# Patient Record
Sex: Male | Born: 1975 | Race: White | Hispanic: No | Marital: Married | Smoking: Never smoker
Health system: Southern US, Community
[De-identification: ages and names within clinical notes are randomized; demographics above are authoritative.]

## PROBLEM LIST (undated history)

## (undated) DIAGNOSIS — J302 Other seasonal allergic rhinitis: Secondary | ICD-10-CM

## (undated) HISTORY — DX: Other seasonal allergic rhinitis: J30.2

---

## 1998-10-31 HISTORY — PX: INGUINAL HERNIA REPAIR: SUR1180

## 1999-02-01 ENCOUNTER — Ambulatory Visit (HOSPITAL_BASED_OUTPATIENT_CLINIC_OR_DEPARTMENT_OTHER): Admission: RE | Admit: 1999-02-01 | Discharge: 1999-02-01 | Payer: Self-pay | Admitting: General Surgery

## 2000-10-31 HISTORY — PX: LAPAROSCOPIC INGUINAL HERNIA REPAIR: SUR788

## 2002-04-10 ENCOUNTER — Ambulatory Visit (HOSPITAL_COMMUNITY): Admission: RE | Admit: 2002-04-10 | Discharge: 2002-04-10 | Payer: Self-pay | Admitting: General Surgery

## 2006-10-31 HISTORY — PX: WISDOM TOOTH EXTRACTION: SHX21

## 2010-01-30 ENCOUNTER — Emergency Department (HOSPITAL_COMMUNITY): Admission: EM | Admit: 2010-01-30 | Discharge: 2010-01-30 | Payer: Self-pay | Admitting: Emergency Medicine

## 2010-01-30 IMAGING — CR DG CHEST 2V
2 series · 2 of 2 positions shown · non-contrast
Comparison: None.

CLINICAL DATA: Chest pain

CHEST - 2 VIEW

[w chest pa]
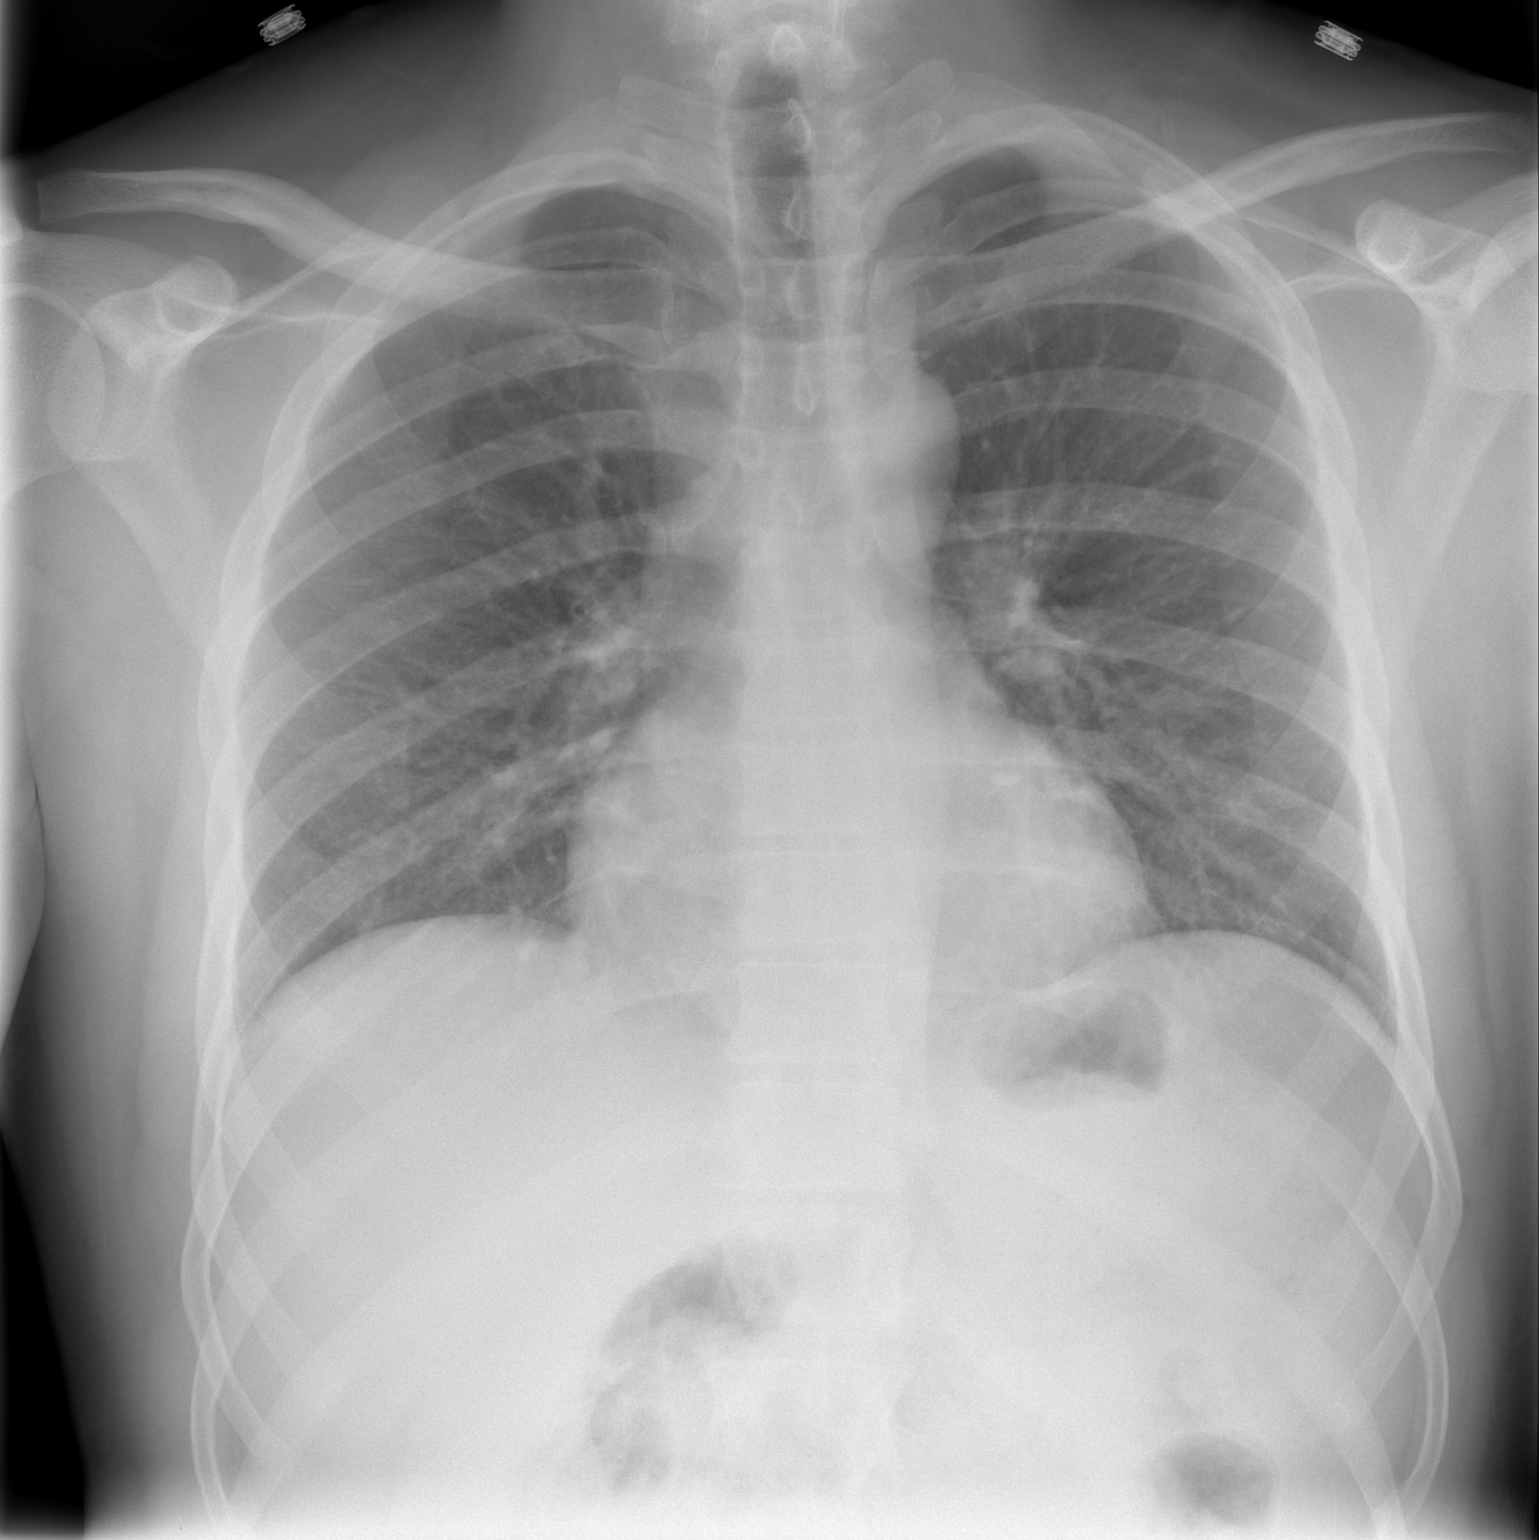

[w chest lat]
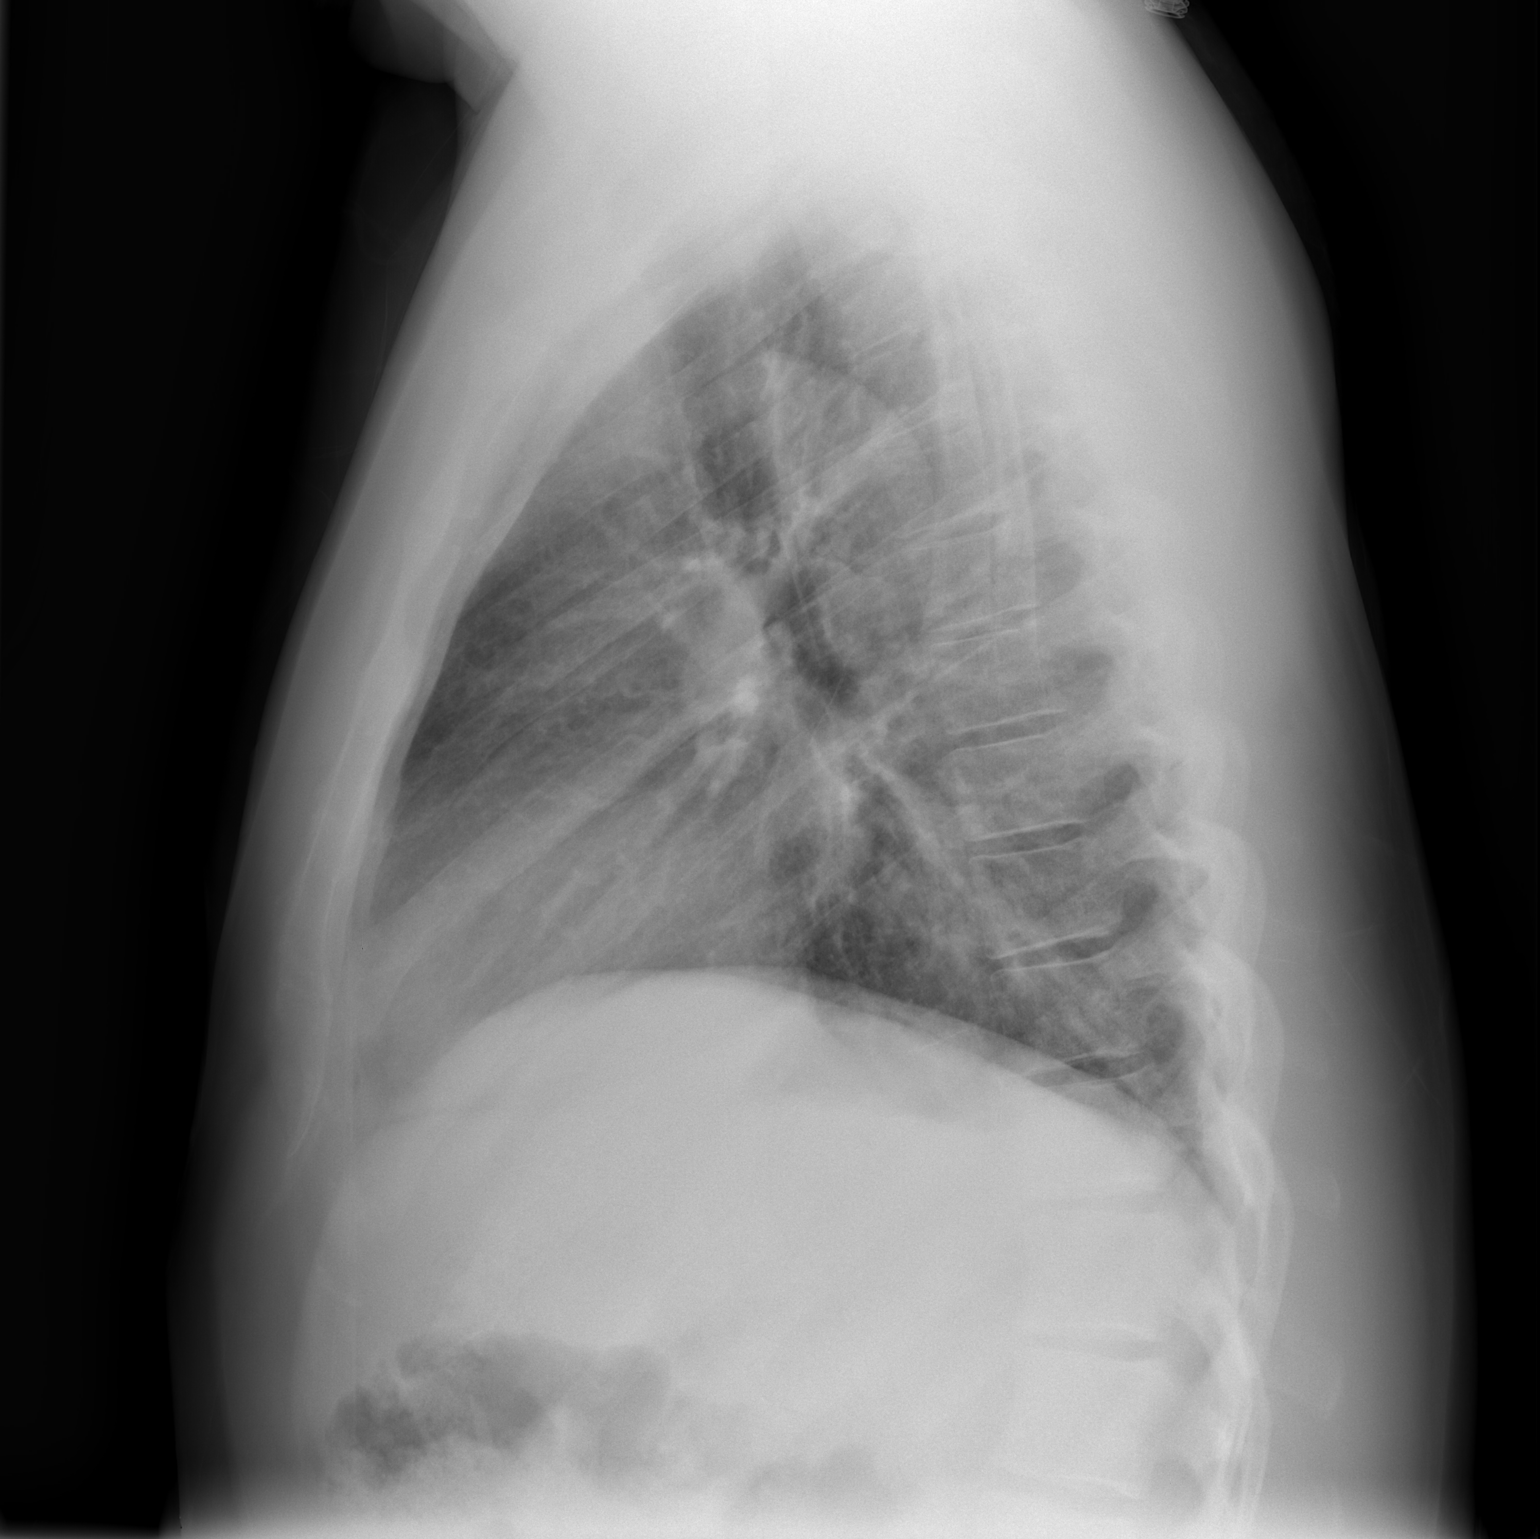

[2 of 2 positions shown; findings below may reference images not displayed]

FINDINGS: Normal mediastinum and heart silhouette.  There is a
coarsened central bronchovascular markings.  No clear focal
consolidation.  No pneumothorax.  No pleural fluid.  No bony
abnormality.
IMPRESSION: Coarsened central bronchovascular markings suggest viral process.
Would also consider bronchitis.

## 2011-01-19 LAB — COMPREHENSIVE METABOLIC PANEL
ALT: 29 U/L (ref 0–53)
BUN: 13 mg/dL (ref 6–23)
CO2: 25 mEq/L (ref 19–32)
Chloride: 105 mEq/L (ref 96–112)
Creatinine, Ser: 0.98 mg/dL (ref 0.4–1.5)
GFR calc Af Amer: >60 mL/min (ref >60)
GFR calc non Af Amer: >60 mL/min (ref >60)
Potassium: 4.5 mEq/L (ref 3.5–5.1)
Sodium: 136 mEq/L (ref 135–145)
Total Bilirubin: UNDETERMINED mg/dL (ref 0.3–1.2)
Total Protein: 6.7 g/dL (ref 6.0–8.3)

## 2011-01-19 LAB — CBC
Hemoglobin: 15.4 g/dL (ref 13.0–17.0)
MCV: 91 fL (ref 78.0–100.0)
Platelets: 222 10*3/uL (ref 150–400)
RBC: 5.04 MIL/uL (ref 4.22–5.81)

## 2011-01-19 LAB — DIFFERENTIAL
Eosinophils Absolute: 0.2 10*3/uL (ref 0.0–0.7)
Eosinophils Relative: 3 % (ref 0–5)
Lymphocytes Relative: 31 % (ref 12–46)
Lymphs Abs: 2.9 10*3/uL (ref 0.7–4.0)
Neutro Abs: 5.7 10*3/uL (ref 1.7–7.7)

## 2011-01-19 LAB — D-DIMER, QUANTITATIVE: D-Dimer, Quant: <0.22 ug/mL-FEU (ref 0.00–0.48)

## 2011-03-18 NOTE — Op Note (Signed)
[facility]  Patient:    [name], [name] Visit Number: [id number] MRN: [id number]          Service Type: DSU Location: DAY Attending Physician:  Delsa Bern Dictated by:   Lorne Skeens. Hoxworth, M.D. Proc. Date: 04/10/02 Admit Date:  04/10/2002                             Operative Report  PREOPERATIVE DIAGNOSIS:  Right inguinal hernia.  POSTOPERATIVE DIAGNOSIS:  Right inguinal hernia.  OPERATION:  Laparoscopic repair of right inguinal hernia.  SURGEON:  Lorne Skeens. Hoxworth, M.D.  ANESTHESIA:  General.  BRIEF HISTORY:  Jared Trevino is a 35 year old white male with a history of left inguinal hernia repair.  He now presents with a symptomatic reducible right inguinal hernia.  On this occasion, he has requested laparoscopic repair.  The nature of the procedure, its indications and risks of bleeding, infection were discussed and understood preoperatively.  He is now brought to the operating room for this procedure.  DESCRIPTION OF PROCEDURE:  The patient was brought to the operating room and placed in the supine position on the operating table.  General anesthesia was induced.  Foley catheter was place.  Preoperative antibiotics were given.  The abdomen was sterilely prepped and draped.  Local anesthesia was used to infiltrate the trocar sites.  A 1 cm incision was made in the umbilicus and dissection carried down to the midline fascia.  The rectus sheath was incised transversely for 1 cm and the right rectus muscle retracted laterally and the preperitoneal space entered.  The balloon dissection was passed into the space and its tip passed down to the pubic symphysis, and it was inflated.  There was good dissection of the right side.  The balloon was left in place for several minutes and then removed, and the balloon replaced and CO2 pressure applied.  Under direct vision, two 5 mm trocars were placed into the preperitoneal space.   Pubic symphysis was identified, and the Coopers ligament had been well dissected down to the iliac vessels which were identified and protected.  The direct space was well dissected, and there was no direct defect.  The cord structures were identified, and there was an obvious good size indirect sac extending along with the cord structures through the internal ring.  Laterally, the peritoneum had been well dissected off of the anterior abdominal wall, and this was dissected further out to the iliac spine.  Working medially back long the edge of the peritoneum, it seemed to be coursing along with the cord structures.  The indirect sac was then dissected away from cord structures.  There were a fair amount of adhesions between the cord and the sac, and the dissection was somewhat tedious but the sac was able to be separated and completely reduced out of the internal ring intact.  It was stripped back posteriorly away from the cord structures until it was well posterior and broadening out into the general peritoneal cavity. After the thorough dissection a large piece of Bard right-sided mesh was used oriented with suture and placed into the preperitoneal space.  It was unfurled and oriented and tacked initially to the pubic symphysis and then along Coopers ligament, down to but protecting the iliac vessels.  This was well unfurled laterally and tacked to the anterior abdominal wall where we could feel each tack through the anterior abdominal wall beginning along the anterior iliac spine  and working back medially avoiding the epigastric vessels.  The good size indirect sac was placed deep to the mesh and held in place with one tack.  With the inferior lateral edge of the mesh held in place, CO2 was removed and trocars removed.  The fascial defect at the umbilicus was repaired with a figure-of-eight suture of 0 Vicryl.  Skin incisions were closed with interrupted subcuticular 4-0 Monocryl  and Steri-Strips.  Sponge, needle and instrument counts were correct.  Dry sterile dressings were applied, and the patient was taken to recovery in good condition. Dictated by:   Lorne Skeens. Hoxworth, M.D. Attending Physician:  Delsa Bern DD:  04/10/02 TD:  04/12/02 Job: [id number]

## 2017-12-14 NOTE — Progress Notes (Signed)
Establish care and Acute visit  Assessment and Plan:  Jared Trevino was seen today for new patient (initial visit), cough and dizziness.  Diagnoses and all orders for this visit:  Encounter to establish care with new doctor  Fatigue, unspecified type -     CBC with Differential/Platelet -     BASIC METABOLIC PANEL WITH GFR -     Hepatic function panel  Dizziness Neuro exam unremarkable - likely mild labyrinthitis r/t a viral URI-   -     predniSONE (DELTASONE) 20 MG tablet; 2 tablets daily for 3 days, 1 tablet daily for 4 days. -     meclizine (ANTIVERT) 25 MG tablet; 1/2-1 pill up to 3 times daily for motion sickness/dizziness  Other orders Low suspicion based on history, patient has high deductible and requests to defer based on cost -     Cancel: Jared Trevino virus VCA antibody panel  Discussed med's effects and SE's. Screening labs and tests as requested with regular follow-up as recommended. Over 30 minutes of exam, counseling, chart review and critical decision making was performed  Future Appointments  Date Time Provider [place]  06/14/2018 10:00 AM Jared Comber, NP [facility] None     HPI BP 122/84   Pulse 68   Temp 97.7 F (36.5 C)   Wt 215 lb (97.5 kg)   SpO2 96%   42 y/o Caucasian Male Patient not currently established with a primary care, no significant medical history other than seasonal allergies presents to establish care and for acute visit - referred by physician friend to "rule out mono" - for recent intermittent dizziness x 4 weeks, mild fatigue x 2 weeks which is improving, and mild cough x 1 week productive of small amount of clear to white mucus. Denies sore throat, severe malaise, fever/ chills, tenderness of neck, headache, body aches. He does endorse some nasal congestion and sense of ear fullness bilaterally without decreased hearing, pain, discharge. He denies chest pain, palpitations, dyspnea, LE edema, extremity or neck pain. Denies nausea,  vomiting, abdominal pain, diarrhea.   He reports dizziness intermittent with the "room spinning" - no accompaniments, typically happens when he is upright, not notably associated with     Has not had the flu vaccine, typically declines.    Reports recent labwork limited to urinalysis with STD testing in 07/2017 for epididymitis - reports all negative/unremarkable.   Current Medications:  No current outpatient medications on file prior to visit.   No current facility-administered medications on file prior to visit.    Allergies:  Not on File Health Maintenance:   There is no immunization history on file for this patient.   Patient Care Team: Jared Pinto, MD as PCP - General (Internal Medicine)  Medical History:  does not have a problem list on file. Surgical History:  He  has a past surgical history that includes Inguinal hernia repair (Left, 2000); Laparoscopic inguinal hernia repair (Right, 2002); and Wisdom tooth extraction (2008). Family History:  His family history includes Breast cancer in his mother; CAD in his paternal grandfather; Heart attack (age of onset: 50) in his paternal grandfather; Melanoma (age of onset: 71) in his father. Social History:   reports that  has never smoked. he has never used smokeless tobacco. He reports that he drinks about 1.2 oz of alcohol per week. He reports that he does not use drugs. Review of Systems:  Review of Systems  Constitutional: Negative for chills, diaphoresis, fever, malaise/fatigue and weight loss.  HENT: Positive  for congestion. Negative for ear pain, hearing loss, sinus pain, sore throat and tinnitus.   Eyes: Negative for blurred vision and double vision.  Respiratory: Positive for cough (Mildly productive, was dry, now productive of small amount of clear discharge). Negative for hemoptysis, sputum production, shortness of breath and wheezing.   Cardiovascular: Negative for chest pain, palpitations, orthopnea,  claudication, leg swelling and PND.  Gastrointestinal: Negative for abdominal pain, blood in stool, constipation, diarrhea, heartburn, melena, nausea and vomiting.  Genitourinary: Negative.   Musculoskeletal: Negative.  Negative for joint pain and myalgias.  Skin: Negative for rash.  Neurological: Positive for dizziness. Negative for tingling, sensory change, focal weakness, weakness and headaches (Single day of mild headache last week).  Endo/Heme/Allergies: Negative for environmental allergies (Has intermittently in fall and spring).  Psychiatric/Behavioral: Negative for depression, memory loss and substance abuse. The patient is not nervous/anxious and does not have insomnia.     Physical Exam: There is no height or weight on file to calculate BMI. BP 122/84   Pulse 68   Temp 97.7 F (36.5 C)   Wt 215 lb (97.5 kg)   SpO2 96%  General Appearance: Well nourished, in no apparent distress.  Eyes: PERRLA, EOMs, conjunctiva no swelling or erythema.  Sinuses: No Frontal/maxillary tenderness  ENT/Mouth: Ext aud canals clear, normal light reflex with TMs without erythema, bulging. Good dentition. No erythema, swelling, or exudate on post pharynx. Tonsils not swollen or erythematous. Hearing normal.  Neck: Supple, thyroid normal. No bruits  Respiratory: Respiratory effort normal, BS equal bilaterally without rales, rhonchi, wheezing or stridor.  Cardio: RRR without murmurs, rubs or gallops. Brisk peripheral pulses without edema.  Chest: symmetric, with normal excursions and percussion.  Abdomen: Soft, nontender, no guarding, rebound, hernias, masses, or organomegaly.  Lymphatics: Non tender without lymphadenopathy.  Genitourinary: Defer Musculoskeletal: Full ROM all peripheral extremities,5/5 strength, and normal gait.  Skin: Warm, dry without rashes, lesions, ecchymosis. Neuro: Cranial nerves intact, reflexes equal bilaterally. Normal muscle tone, no cerebellar symptoms. Sensation intact.   Psych: Awake and oriented X 3, normal affect, Insight and Judgment appropriate.   EKG: Defer to [place] 11:53 AM [facility]

## 2017-12-15 ENCOUNTER — Encounter: Payer: Self-pay | Admitting: Adult Health

## 2017-12-15 ENCOUNTER — Ambulatory Visit (INDEPENDENT_AMBULATORY_CARE_PROVIDER_SITE_OTHER): Payer: PRIVATE HEALTH INSURANCE | Admitting: Adult Health

## 2017-12-15 VITALS — BP 122/84 | HR 68 | Temp 97.7°F | Wt 215.0 lb

## 2017-12-15 DIAGNOSIS — R5383 Other fatigue: Secondary | ICD-10-CM

## 2017-12-15 DIAGNOSIS — R42 Dizziness and giddiness: Secondary | ICD-10-CM

## 2017-12-15 DIAGNOSIS — Z7689 Persons encountering health services in other specified circumstances: Secondary | ICD-10-CM

## 2017-12-15 LAB — CBC WITH DIFFERENTIAL/PLATELET
BASOS ABS: 42 {cells}/uL (ref 0–200)
Basophils Relative: 0.8 %
EOS ABS: 302 {cells}/uL (ref 15–500)
EOS PCT: 5.7 %
HCT: 45.7 % (ref 38.5–50.0)
HEMOGLOBIN: 16.4 g/dL (ref 13.2–17.1)
Lymphs Abs: 1606 cells/uL (ref 850–3900)
MCH: 30.1 pg (ref 27.0–33.0)
MCHC: 35.9 g/dL (ref 32.0–36.0)
MCV: 84 fL (ref 80.0–100.0)
MONOS PCT: 8.6 %
MPV: 9.7 fL (ref 7.5–12.5)
NEUTROS ABS: 2894 {cells}/uL (ref 1500–7800)
NEUTROS PCT: 54.6 %
Platelets: 289 10*3/uL (ref 140–400)
RBC: 5.44 10*6/uL (ref 4.20–5.80)
RDW: 12.6 % (ref 11.0–15.0)
TOTAL LYMPHOCYTE: 30.3 %
WBC mixed population: 456 cells/uL (ref 200–950)
WBC: 5.3 10*3/uL (ref 3.8–10.8)

## 2017-12-15 LAB — BASIC METABOLIC PANEL WITH GFR
BUN: 14 mg/dL (ref 7–25)
CO2: 26 mmol/L (ref 20–32)
CREATININE: 1.1 mg/dL (ref 0.60–1.35)
Calcium: 10 mg/dL (ref 8.6–10.3)
Chloride: 104 mmol/L (ref 98–110)
GFR, Est African American: 97 mL/min/{1.73_m2} (ref >=60)
GFR, Est Non African American: 84 mL/min/{1.73_m2} (ref >=60)
GLUCOSE: 90 mg/dL (ref 65–99)
POTASSIUM: 4.1 mmol/L (ref 3.5–5.3)
Sodium: 140 mmol/L (ref 135–146)

## 2017-12-15 LAB — HEPATIC FUNCTION PANEL
AG RATIO: 2.1 (calc) (ref 1.0–2.5)
ALKALINE PHOSPHATASE (APISO): 52 U/L (ref 40–115)
ALT: 26 U/L (ref 9–46)
AST: 16 U/L (ref 10–40)
Albumin: 4.8 g/dL (ref 3.6–5.1)
BILIRUBIN TOTAL: 0.7 mg/dL (ref 0.2–1.2)
Bilirubin, Direct: 0.1 mg/dL (ref 0.0–0.2)
Globulin: 2.3 g/dL (calc) (ref 1.9–3.7)
Indirect Bilirubin: 0.6 mg/dL (calc) (ref 0.2–1.2)
TOTAL PROTEIN: 7.1 g/dL (ref 6.1–8.1)

## 2017-12-15 MED ORDER — MECLIZINE HCL 25 MG PO TABS: ORAL_TABLET | ORAL | 0 refills | Status: AC

## 2017-12-15 MED ORDER — PREDNISONE 20 MG PO TABS: ORAL_TABLET | ORAL | 0 refills | Status: DC

## 2017-12-15 NOTE — Patient Instructions (Addendum)
I'm sending in prednisone and antivert for dizziness - call me in a few weeks if still having lots of symptoms, or any new questions or concerns.    Labyrinthitis Labyrinthitis is an infection of the inner ear. Your inner ear is a fluid-filled system of tubes and canals (labyrinth). Nerve cells in your inner ear send signals for hearing and balance to your brain. When tiny germs (microorganisms) get inside the labyrinth, they harm the cells that send messages to the brain. Labyrinthitis can cause changes in hearing and balance. Most cases of labyrinthitis come on suddenly and they clear up within weeks. If the infection damages parts of the labyrinth, some symptoms may remain (chronic labyrinthitis). What are the causes? Viruses are the most common cause of labyrinthitis. Viruses that spread into the labyrinth are the same viruses that cause other diseases, such as:  Mononucleosis.  Measles.  Flu.  Herpes.  Bacteria can also cause labyrinthitis when they spread into the labyrinth from an infection in the brain or the middle ear. Bacteria can cause:  Serous labyrinthitis. This type of labyrinthitis develops when bacteria produce a poison (toxin) that gets inside the labyrinth.  Suppurative labyrinthitis. This type of labyrinthitis develops when bacteria get inside the labyrinth.  What increases the risk? You may be at greater risk for labyrinthitis if you:  Drink a lot of alcohol.  Smoke.  Take certain drugs.  Are not well rested (fatigued).  Are under a lot of stress.  Have allergies.  Recently had a nose or throat infection (upper respiratory infection) or an ear infection.  What are the signs or symptoms? Symptoms of labyrinthitis usually start suddenly. The symptoms can be mild or strong and may include:  Dizziness.  Hearing loss.  A feeling that you are moving when you are not (vertigo).  Ringing in the ear (tinnitus).  Nausea and vomiting.  Trouble focusing  your eyes.  Symptoms of chronic labyrinthitis may include:  Fatigue.  Confusion.  Hearing loss.  Tinnitus.  Poor balance.  Vertigo after sudden head movements.  How is this diagnosed? Your health care provider may suspect labyrinthitis if you suddenly get dizzy and lose hearing, especially if you had a recent upper respiratory infection. Your health care provider will perform a physical exam to:  Check your ears for infection.  Test your balance.  Check your eye movement.  Your health care provider may do several tests to rule out other causes of your symptoms and to help make a diagnosis of labyrinthitis. These may include:  Imaging studies, such as a CT scan or an MRI, to look for other causes of your symptoms.  Hearing tests.  Electronystagmography (ENG) to check your balance.  How is this treated? Treatment of labyrinthitis depends on the cause. If your labyrinthitis is caused by a virus, it may get better without treatment. If your labyrinthitis is caused by bacteria, you may need medicine to fight the infection (antibiotic medicine). You may also have treatment to relieve labyrinthitis symptoms. Treatments may include:  Medicines to: ? Stop dizziness. ? Relieve nausea. ? Treat the inflamed area. ? Speed up your recovery.  Bed rest until dizziness goes away.  Fluids given through an IV tube. You may need this treatment if you have too little fluid in your body (dehydrated) from repeated nausea and vomiting.  Follow these instructions at home:  Take medicines only as directed by your health care provider.  If you were prescribed an antibiotic medicine, finish all of it  even if you start to feel better.  Rest as much as possible.  Avoid loud noises and bright lights.  Do not make sudden movements until any dizziness goes away.  Do not drive until your health care provider says that you can.  Drink enough fluid to keep your urine clear or pale  yellow.  Work with a physical therapist if you still feel dizzy after several weeks. A therapist can teach you exercises to help you adjust to feeling dizzy (vestibular rehabilitation exercises).  Keep all follow-up visits as directed by your health care provider. This is important. Contact a health care provider if:  Your symptoms are not relieved by medicines.  Your symptoms last longer than two weeks.  You have a fever. Get help right away if:  You become very dizzy.  You have nausea or vomiting that does not go away.  Your hearing gets much worse very quickly. This information is not intended to replace advice given to you by your health care provider. Make sure you discuss any questions you have with your health care provider. Document Released: 11/07/2014 Document Revised: 03/24/2016 Document Reviewed: 06/18/2014 Elsevier Interactive Patient Education  2018 [employer].    Common causes of cough OR hoarseness OR sore throat:   Allergies, Viral Infections, Acid Reflux and Bacterial Infections.   Allergies and viral infections cause a cough OR sore throat by post nasal drip and are often worse at night, can also have sneezing, lower grade fevers, clear/yellow mucus. This is best treated with allergy medications or nasal sprays.  Please get on allegra for 1-2 weeks The strongest is allegra or fexafinadine  Cheapest at [employer], [employer], [employer]   Bacterial infections are more severe than allergies or viral infections with fever, teeth pain, fatigue. This can be treated with prednisone and the same over the counter medication and after 7 days can be treated with an antibiotic.   Silent reflux/GERD can cause a cough OR sore throat OR hoarseness WITHOUT heart burn because the esophagus that goes to the stomach and trachea that goes to the lungs are very close and when you lay down the acid can irritate your throat and lungs. This can cause hoarseness, cough, and wheezing. Please  stop any alcohol or anti-inflammatories like aleve/advil/ibuprofen and start an over the counter Prilosec or omeprazole 1-2 times daily 83mins before food for 2 weeks, then switch to over the counter zantac/ratinidine or pepcid/famotadine once at night for 2 weeks.    sometimes irritation causes more irritation. Try voice rest, use sugar free cough drops to prevent coughing, and try to stop clearing your throat.   If you ever have a cough that does not go away after trying these things please make a follow up visit for further evaluation or we can refer you to a specialist. Or if you ever have shortness of breath or chest pain go to the ER.

## 2018-06-14 ENCOUNTER — Encounter: Payer: Self-pay | Admitting: Adult Health

## 2019-06-20 ENCOUNTER — Encounter: Payer: Self-pay | Admitting: Adult Health

## 2020-07-21 DIAGNOSIS — H5203 Hypermetropia, bilateral: Secondary | ICD-10-CM | POA: Diagnosis not present

## 2020-07-21 DIAGNOSIS — H52203 Unspecified astigmatism, bilateral: Secondary | ICD-10-CM | POA: Diagnosis not present

## 2020-07-21 DIAGNOSIS — H18831 Recurrent erosion of cornea, right eye: Secondary | ICD-10-CM | POA: Diagnosis not present

## 2020-11-07 DIAGNOSIS — Z20822 Contact with and (suspected) exposure to covid-19: Secondary | ICD-10-CM | POA: Diagnosis not present

## 2021-12-03 DIAGNOSIS — Z125 Encounter for screening for malignant neoplasm of prostate: Secondary | ICD-10-CM | POA: Diagnosis not present

## 2021-12-03 DIAGNOSIS — E785 Hyperlipidemia, unspecified: Secondary | ICD-10-CM | POA: Diagnosis not present

## 2021-12-10 DIAGNOSIS — N390 Urinary tract infection, site not specified: Secondary | ICD-10-CM | POA: Diagnosis not present

## 2021-12-10 DIAGNOSIS — Z1339 Encounter for screening examination for other mental health and behavioral disorders: Secondary | ICD-10-CM | POA: Diagnosis not present

## 2021-12-10 DIAGNOSIS — Z0000 Encounter for general adult medical examination without abnormal findings: Secondary | ICD-10-CM | POA: Diagnosis not present

## 2021-12-10 DIAGNOSIS — Z1331 Encounter for screening for depression: Secondary | ICD-10-CM | POA: Diagnosis not present

## 2021-12-10 DIAGNOSIS — J309 Allergic rhinitis, unspecified: Secondary | ICD-10-CM | POA: Diagnosis not present

## 2022-03-09 DIAGNOSIS — R972 Elevated prostate specific antigen [PSA]: Secondary | ICD-10-CM | POA: Diagnosis not present

## 2022-03-11 ENCOUNTER — Other Ambulatory Visit: Payer: Self-pay | Admitting: Urology

## 2022-03-11 DIAGNOSIS — R972 Elevated prostate specific antigen [PSA]: Secondary | ICD-10-CM

## 2022-03-25 ENCOUNTER — Ambulatory Visit
Admission: RE | Admit: 2022-03-25 | Discharge: 2022-03-25 | Disposition: A | Discharge status: Home / Self Care | Payer: BC Managed Care – PPO | Source: Ambulatory Visit | Attending: Urology | Admitting: Urology

## 2022-03-25 DIAGNOSIS — R972 Elevated prostate specific antigen [PSA]: Secondary | ICD-10-CM | POA: Diagnosis not present

## 2022-03-25 DIAGNOSIS — R590 Localized enlarged lymph nodes: Secondary | ICD-10-CM | POA: Diagnosis not present

## 2022-03-25 IMAGING — MR MR PROSTATE WO/W CM
12 series · 48 of 48 positions shown · IV contrast (multihance)
Comparison: None Available.

CLINICAL DATA: Elevated PSA equal [REDACTED]-year-old male

EXAM:
MR PROSTATE WITHOUT AND WITH CONTRAST
TECHNIQUE: Multiplanar multisequence MRI images were obtained of the pelvis
centered about the prostate. Pre and post contrast images were
obtained.
CONTRAST:  20mL MULTIHANCE GADOBENATE DIMEGLUMINE 529 MG/ML IV SOLN

[Series 3: T2 · coronal · 3.0mm · 0.56mm/px · 1 of 23 slices shown (1 of 3)]
[im 1/23]
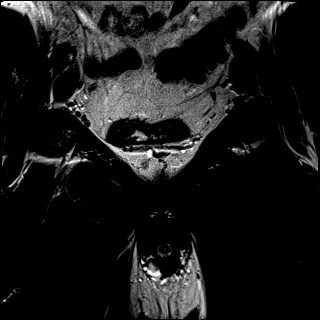

[Series 4: T1 · axial · 5.0mm · 1.25mm/px · 1 of 80 slices shown]
[im 1/80]
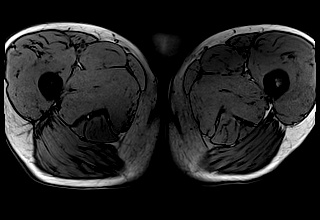

[Series 5: DWI · axial · 3.0mm · 1.75mm/px · z∈[+1,+58]mm · 2 of 60 slices shown (1 of 3)]
[im 1/60]
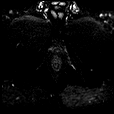
[im 60/60]
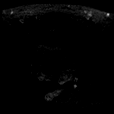

[Series 6: DWI · axial · 3.0mm · 1.75mm/px · 1 of 20 slices shown (2 of 3)]
[im 1/20]
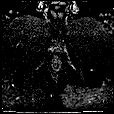

[Series 7: DWI · axial · 3.0mm · 1.75mm/px · 1 of 20 slices shown (3 of 3)]
[im 1/20]
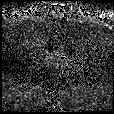

[Series 8: T2 · axial · 3.0mm · 0.56mm/px · 1 of 23 slices shown (2 of 3)]
[im 1/23]
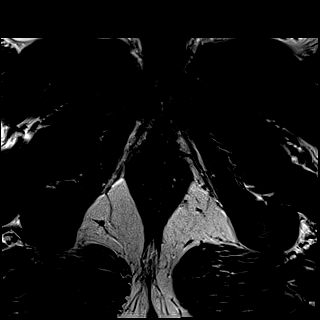

[Series 9: T2 · axial · 1.0mm · 1.04mm/px · z∈[-6,+65]mm · 2 of 72 slices shown (3 of 3)]
[im 1/72]
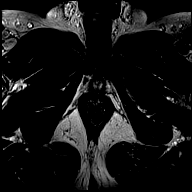
[im 72/72]
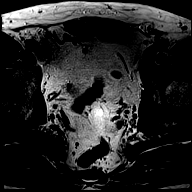

[Series 10: pre t1_twist_tra_dyn · axial · non-contrast · 3.5mm · 0.83mm/px · 1 of 20 slices shown]
[im 1/20]
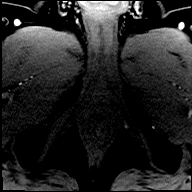

[Series 11: post t1_twist_tra_dyn-copy center · axial · non-contrast · 3.5mm · 0.83mm/px · z∈[-8,+59]mm · 17 of 600 slices shown]
[im 1/600]
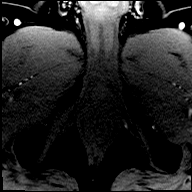
[im 38/600]
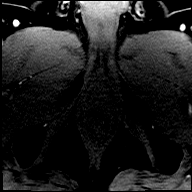
[im 75/600]
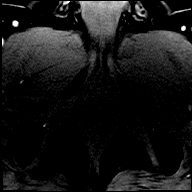
[im 113/600]
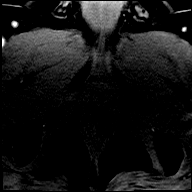
[im 150/600]
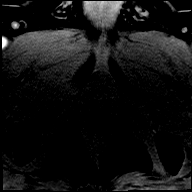
[im 188/600]
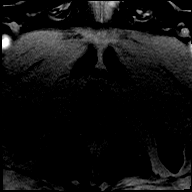
[im 225/600]
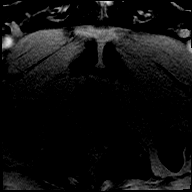
[im 263/600]
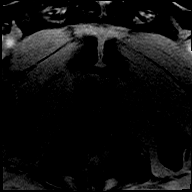
[im 300/600]
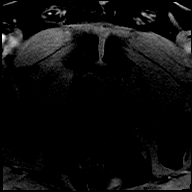
[im 337/600]
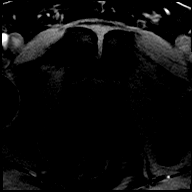
[im 375/600]
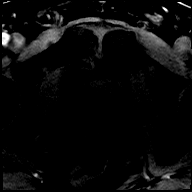
[im 412/600]
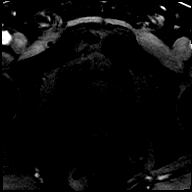
[im 450/600]
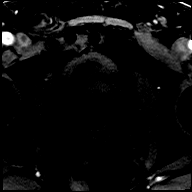
[im 487/600]
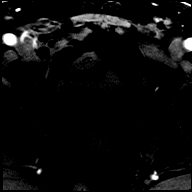
[im 525/600]
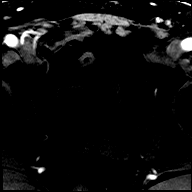
[im 562/600]
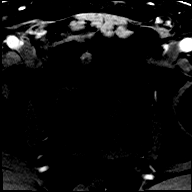
[im 600/600]
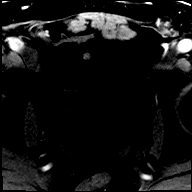

[Series 12: post t1_twist_tra_dyn-copy cent_sub · axial · 3.5mm · 0.83mm/px · z∈[-8,+59]mm · 17 of 580 slices shown]
[im 1/580]
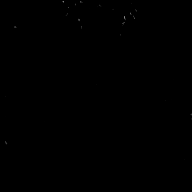
[im 37/580]
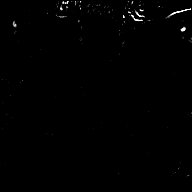
[im 73/580]
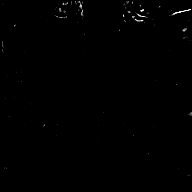
[im 109/580]
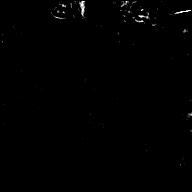
[im 145/580]
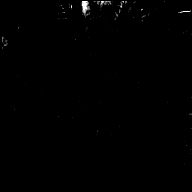
[im 181/580]
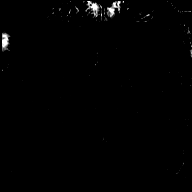
[im 218/580]
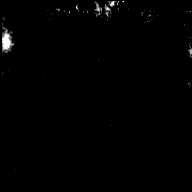
[im 254/580]
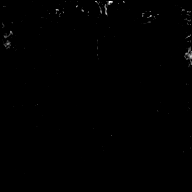
[im 290/580]
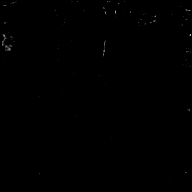
[im 326/580]
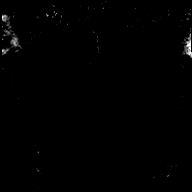
[im 362/580]
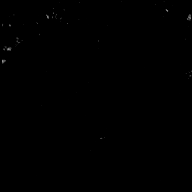
[im 399/580]
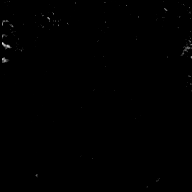
[im 435/580]
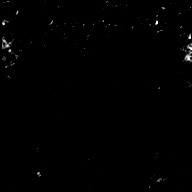
[im 471/580]
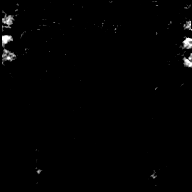
[im 507/580]
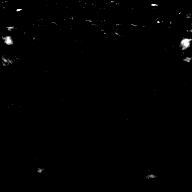
[im 543/580]
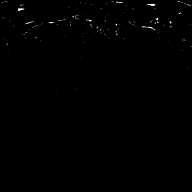
[im 580/580]
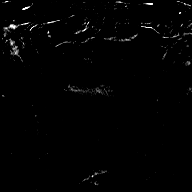

[Series 13: t1_vibe_dixon_tra_f · axial · 2.5mm · 0.91mm/px · z∈[-72,+126]mm · 2 of 80 slices shown]
[im 1/80]
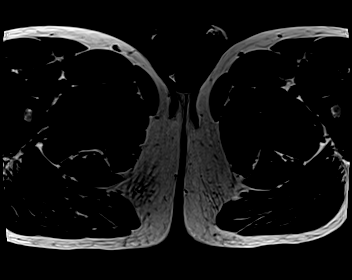
[im 80/80]
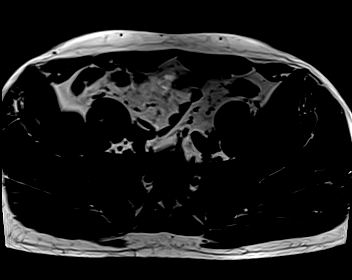

[Series 14: t1_vibe_dixon_tra_w · axial · 2.5mm · 0.91mm/px · z∈[-72,+126]mm · 2 of 80 slices shown]
[im 1/80]
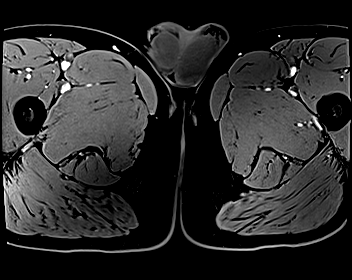
[im 80/80]
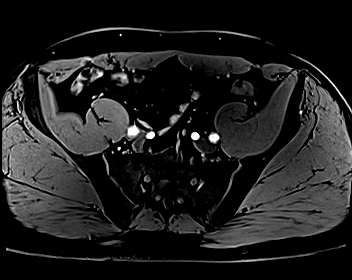

[48 of 48 positions shown; findings below may reference images not displayed]

FINDINGS: Prostate:

Peripheral zone: No foci of restricted diffusion within the
peripheral zone (series 6). Normal high signal intensity within the
peripheral zone on T2 weighted imaging. There are several linear
striations within the gland posteriorly.

Transitional zone:The transitional zone is minimally enlarged by
capsulated nodules without suspicious imaging characteristics on T2
weighted imaging.

No suspicious enhancement pattern on postcontrast T1 weighted
imaging.

Volume: 5.2 x 4.7 by 4.2 cm volume equal 53.4 cc

Transcapsular spread:  Absent

Seminal vesicle involvement: Absent

Neurovascular bundle involvement: Absent

Pelvic adenopathy: Absent

Bone metastasis: Absent

Other findings: None
IMPRESSION: 1. No high-grade carcinoma identified in the peripheral zone. Linear
striations suggest prior inflammation. PI-RADS: 2
2. Minimally enlarged nodular transitional zone most consistent with
benign prostate nodularity. PI-RADS: 2

## 2022-03-25 IMAGING — MR MR PROSTATE WO/W CM
5 series · 48 of 48 positions shown · IV contrast (multihance)
Comparison: None Available.

CLINICAL DATA: Elevated PSA equal [REDACTED]-year-old male

EXAM:
MR PROSTATE WITHOUT AND WITH CONTRAST
TECHNIQUE: Multiplanar multisequence MRI images were obtained of the pelvis
centered about the prostate. Pre and post contrast images were
obtained.
CONTRAST:  20mL MULTIHANCE GADOBENATE DIMEGLUMINE 529 MG/ML IV SOLN

[Series 3: pre t1_twist_tra_dyn · axial · non-contrast · 3.5mm · 0.83mm/px · 1 of 20 slices shown]
[im 1/20]
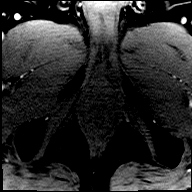

[Series 4: post t1_twist_tra_dyn-copy center · axial · non-contrast · 3.5mm · 0.83mm/px · z∈[-12,+54]mm · 21 of 600 slices shown]
[im 1/600]
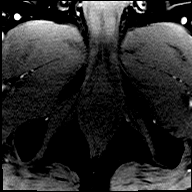
[im 30/600]
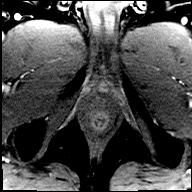
[im 60/600]
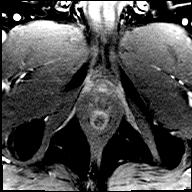
[im 90/600]
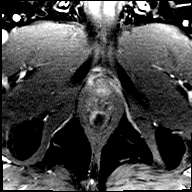
[im 120/600]
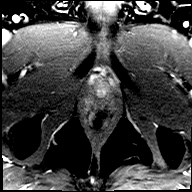
[im 150/600]
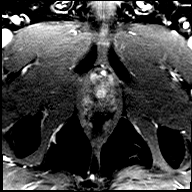
[im 180/600]
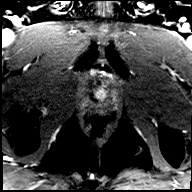
[im 210/600]
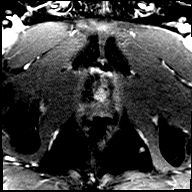
[im 240/600]
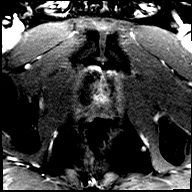
[im 270/600]
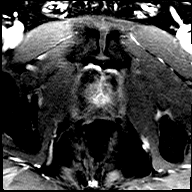
[im 300/600]
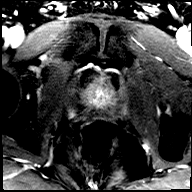
[im 330/600]
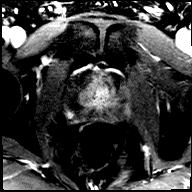
[im 360/600]
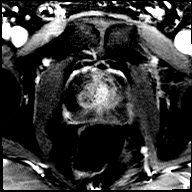
[im 390/600]
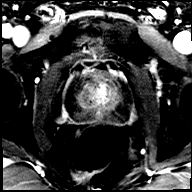
[im 420/600]
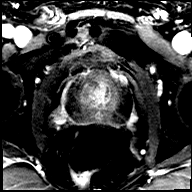
[im 450/600]
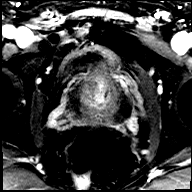
[im 480/600]
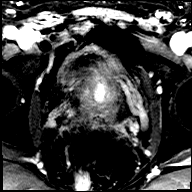
[im 510/600]
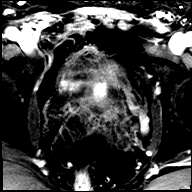
[im 540/600]
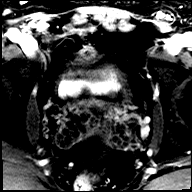
[im 570/600]
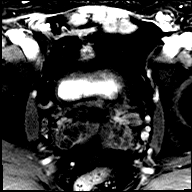
[im 600/600]
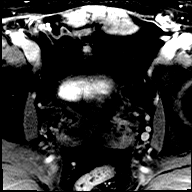

[Series 5: post t1_twist_tra_dyn-copy cent_sub · axial · 3.5mm · 0.83mm/px · z∈[-12,+54]mm · 20 of 580 slices shown]
[im 1/580]
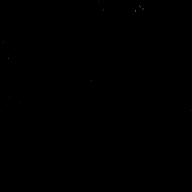
[im 31/580]
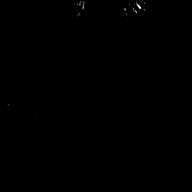
[im 61/580]
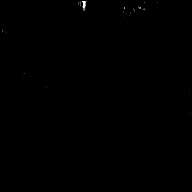
[im 92/580]
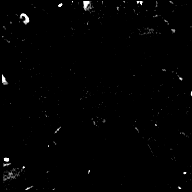
[im 122/580]
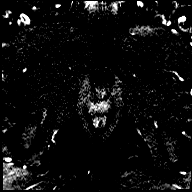
[im 153/580]
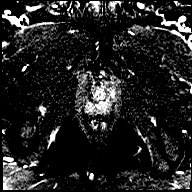
[im 183/580]
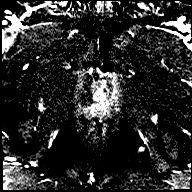
[im 214/580]
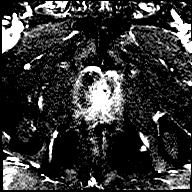
[im 244/580]
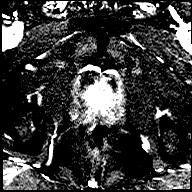
[im 275/580]
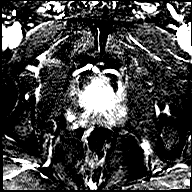
[im 305/580]
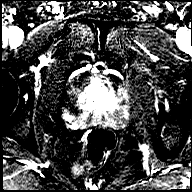
[im 336/580]
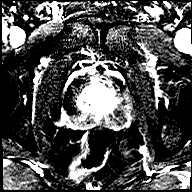
[im 366/580]
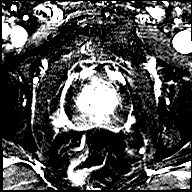
[im 397/580]
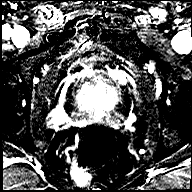
[im 427/580]
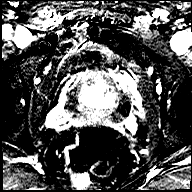
[im 458/580]
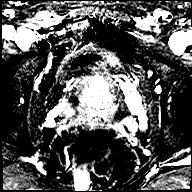
[im 488/580]
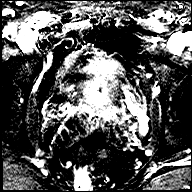
[im 519/580]
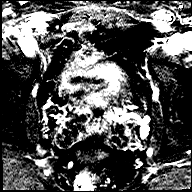
[im 549/580]
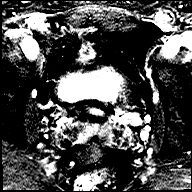
[im 580/580]
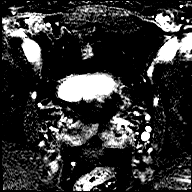

[Series 6: t1_vibe_dixon_tra_f · axial · 2.5mm · 0.91mm/px · z∈[-81,+116]mm · 3 of 80 slices shown]
[im 1/80]
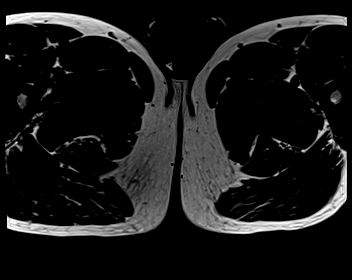
[im 40/80]
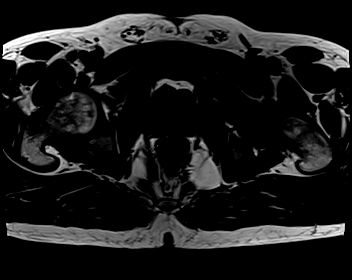
[im 80/80]
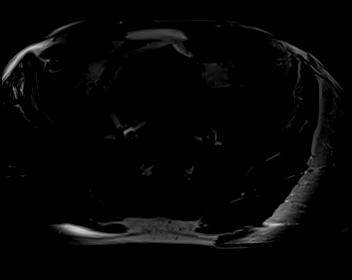

[Series 7: t1_vibe_dixon_tra_w · axial · 2.5mm · 0.91mm/px · z∈[-81,+116]mm · 3 of 80 slices shown]
[im 1/80]
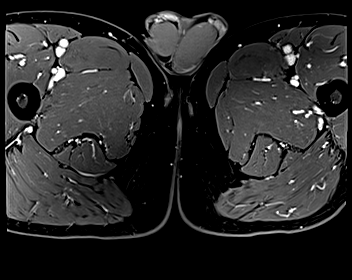
[im 40/80]
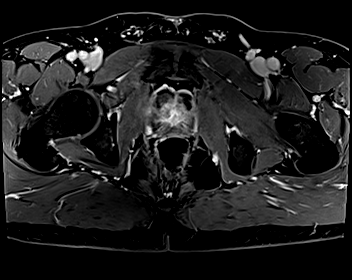
[im 80/80]
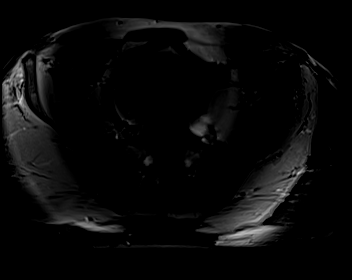

[48 of 48 positions shown; findings below may reference images not displayed]

FINDINGS: Prostate:

Peripheral zone: No foci of restricted diffusion within the
peripheral zone (series 6). Normal high signal intensity within the
peripheral zone on T2 weighted imaging. There are several linear
striations within the gland posteriorly.

Transitional zone:The transitional zone is minimally enlarged by
capsulated nodules without suspicious imaging characteristics on T2
weighted imaging.

No suspicious enhancement pattern on postcontrast T1 weighted
imaging.

Volume: 5.2 x 4.7 by 4.2 cm volume equal 53.4 cc

Transcapsular spread:  Absent

Seminal vesicle involvement: Absent

Neurovascular bundle involvement: Absent

Pelvic adenopathy: Absent

Bone metastasis: Absent

Other findings: None
IMPRESSION: 1. No high-grade carcinoma identified in the peripheral zone. Linear
striations suggest prior inflammation. PI-RADS: 2
2. Minimally enlarged nodular transitional zone most consistent with
benign prostate nodularity. PI-RADS: 2

## 2022-03-25 MED ORDER — GADOBENATE DIMEGLUMINE 529 MG/ML IV SOLN
20.0000 mL | Freq: Once | INTRAVENOUS | Status: AC | PRN
  Administered 2022-03-25: 20 mL via INTRAVENOUS

## 2022-04-04 DIAGNOSIS — L218 Other seborrheic dermatitis: Secondary | ICD-10-CM | POA: Diagnosis not present

## 2022-04-04 DIAGNOSIS — D2371 Other benign neoplasm of skin of right lower limb, including hip: Secondary | ICD-10-CM | POA: Diagnosis not present

## 2022-04-04 DIAGNOSIS — L821 Other seborrheic keratosis: Secondary | ICD-10-CM | POA: Diagnosis not present

## 2022-04-04 DIAGNOSIS — L538 Other specified erythematous conditions: Secondary | ICD-10-CM | POA: Diagnosis not present

## 2022-04-04 DIAGNOSIS — L820 Inflamed seborrheic keratosis: Secondary | ICD-10-CM | POA: Diagnosis not present

## 2022-04-04 DIAGNOSIS — L814 Other melanin hyperpigmentation: Secondary | ICD-10-CM | POA: Diagnosis not present

## 2022-04-07 DIAGNOSIS — R972 Elevated prostate specific antigen [PSA]: Secondary | ICD-10-CM | POA: Diagnosis not present

## 2022-04-08 DIAGNOSIS — H579 Unspecified disorder of eye and adnexa: Secondary | ICD-10-CM | POA: Diagnosis not present

## 2022-04-08 DIAGNOSIS — H8110 Benign paroxysmal vertigo, unspecified ear: Secondary | ICD-10-CM | POA: Diagnosis not present

## 2022-04-14 DIAGNOSIS — L810 Postinflammatory hyperpigmentation: Secondary | ICD-10-CM | POA: Diagnosis not present

## 2022-05-17 DIAGNOSIS — R972 Elevated prostate specific antigen [PSA]: Secondary | ICD-10-CM | POA: Diagnosis not present

## 2022-07-27 DIAGNOSIS — G43909 Migraine, unspecified, not intractable, without status migrainosus: Secondary | ICD-10-CM | POA: Diagnosis not present

## 2022-07-27 DIAGNOSIS — H5203 Hypermetropia, bilateral: Secondary | ICD-10-CM | POA: Diagnosis not present

## 2022-09-19 ENCOUNTER — Encounter: Payer: Self-pay | Admitting: Gastroenterology

## 2022-10-12 DIAGNOSIS — R972 Elevated prostate specific antigen [PSA]: Secondary | ICD-10-CM | POA: Diagnosis not present

## 2022-10-14 DIAGNOSIS — R972 Elevated prostate specific antigen [PSA]: Secondary | ICD-10-CM | POA: Diagnosis not present

## 2022-11-09 DIAGNOSIS — R972 Elevated prostate specific antigen [PSA]: Secondary | ICD-10-CM | POA: Diagnosis not present

## 2022-11-10 ENCOUNTER — Ambulatory Visit (AMBULATORY_SURGERY_CENTER): Payer: BC Managed Care – PPO | Admitting: *Deleted

## 2022-11-10 VITALS — Ht 73.0 in | Wt 200.0 lb

## 2022-11-10 DIAGNOSIS — Z1211 Encounter for screening for malignant neoplasm of colon: Secondary | ICD-10-CM

## 2022-11-10 MED ORDER — NA SULFATE-K SULFATE-MG SULF 17.5-3.13-1.6 GM/177ML PO SOLN: 1.0000 | Freq: Once | ORAL | 0 refills | Status: AC

## 2022-11-10 NOTE — Progress Notes (Signed)

## 2022-11-22 ENCOUNTER — Encounter: Payer: Self-pay | Admitting: Gastroenterology

## 2022-11-28 ENCOUNTER — Encounter: Payer: Self-pay | Admitting: Gastroenterology

## 2022-11-28 ENCOUNTER — Ambulatory Visit (AMBULATORY_SURGERY_CENTER): Payer: BC Managed Care – PPO | Admitting: Gastroenterology

## 2022-11-28 VITALS — BP 99/66 | HR 54 | Temp 98.0°F | Resp 14 | Ht 73.0 in | Wt 200.0 lb

## 2022-11-28 DIAGNOSIS — Z1211 Encounter for screening for malignant neoplasm of colon: Secondary | ICD-10-CM

## 2022-11-28 DIAGNOSIS — K635 Polyp of colon: Secondary | ICD-10-CM

## 2022-11-28 DIAGNOSIS — D120 Benign neoplasm of cecum: Secondary | ICD-10-CM

## 2022-11-28 MED ORDER — SODIUM CHLORIDE 0.9 % IV SOLN: 500.0000 mL | Freq: Once | INTRAVENOUS | Status: DC

## 2022-11-28 NOTE — Patient Instructions (Addendum)
Recommendation: Patient has a contact number available for                            emergencies. The signs and symptoms of potential                            delayed complications were discussed with the                            patient. Return to normal activities tomorrow.                            Written discharge instructions were provided to the                            patient.                           - Resume previous diet.                           - Continue present medications.                           - Await pathology results.                           - Repeat colonoscopy (date not yet determined) for                            surveillance based on pathology results.  Handout on polyps and diverticulosis given.  YOU HAD AN ENDOSCOPIC PROCEDURE TODAY AT [facility]:   Refer to the procedure report that was given to you for any specific questions about what was found during the examination.  If the procedure report does not answer your questions, please call your gastroenterologist to clarify.  If you requested that your care partner not be given the details of your procedure findings, then the procedure report has been included in a sealed envelope for you to review at your convenience later.  YOU SHOULD EXPECT: Some feelings of bloating in the abdomen. Passage of more gas than usual.  Walking can help get rid of the air that was put into your GI tract during the procedure and reduce the bloating. If you had a lower endoscopy (such as a colonoscopy or flexible sigmoidoscopy) you may notice spotting of blood in your stool or on the toilet paper. If you underwent a bowel prep for your procedure, you may not have a normal bowel movement for a few days.  Please Note:  You might notice some irritation and congestion in your nose or some drainage.  This is from the oxygen used during your procedure.  There is no need for concern and it should clear up in a day  or so.  SYMPTOMS TO REPORT IMMEDIATELY:  Following lower endoscopy (colonoscopy or flexible sigmoidoscopy):  Excessive amounts of blood in the stool  Significant tenderness or worsening of abdominal pains  Swelling of the abdomen that is new, acute  Fever of 100F or higher  For urgent  or emergent issues, a gastroenterologist can be reached at any hour by calling [phone]. Do not use MyChart messaging for urgent concerns.   DIET:  We do recommend a small meal at first, but then you may proceed to your regular diet.  Drink plenty of fluids but you should avoid alcoholic beverages for 24 hours.  ACTIVITY:  You should plan to take it easy for the rest of today and you should NOT DRIVE or use heavy machinery until tomorrow (because of the sedation medicines used during the test).    FOLLOW UP: Our staff will call the number listed on your records the next business day following your procedure.  We will call around 7:15- 8:00 am to check on you and address any questions or concerns that you may have regarding the information given to you following your procedure. If we do not reach you, we will leave a message.     If any biopsies were taken you will be contacted by phone or by letter within the next 1-3 weeks.  Please call us at [phone] if you have not heard about the biopsies in 3 weeks.   SIGNATURES/CONFIDENTIALITY: You and/or your care partner have signed paperwork which will be entered into your electronic medical record.  These signatures attest to the fact that that the information above on your After Visit Summary has been reviewed and is understood.  Full responsibility of the confidentiality of this discharge information lies with you and/or your care-partner.

## 2022-11-28 NOTE — Progress Notes (Signed)
Pt's states no medical or surgical changes since previsit or office visit. 

## 2022-11-28 NOTE — Progress Notes (Signed)
Called to room to assist during endoscopic procedure.  Patient ID and intended procedure confirmed with present staff. Received instructions for my participation in the procedure from the performing physician.  

## 2022-11-28 NOTE — Progress Notes (Signed)
[facility] History and Physical   Primary Care Physician:  Ginger Organ., MD   Reason for Procedure:   Colon cancer screening  Plan:    Screening colonoscopy     HPI: Jared Trevino is a 47 y.o. male undergoing initial average risk screening colonoscopy.  He has no family history of colon cancer and no chronic GI symptoms.    Past Medical History:  Diagnosis Date   Seasonal allergies     Past Surgical History:  Procedure Laterality Date   INGUINAL HERNIA REPAIR Left 2000   LAPAROSCOPIC INGUINAL HERNIA REPAIR Right 2002   WISDOM TOOTH EXTRACTION  2008    Prior to Admission medications   Medication Sig Start Date End Date Taking? Authorizing Provider  IBUPROFEN PO Take by mouth as needed.    [provider]  meclizine (ANTIVERT) 25 MG tablet 1/2-1 pill up to 3 times daily for motion sickness/dizziness Patient not taking: Reported on 11/10/2022 12/15/17   Liane Comber, NP    Current Outpatient Medications  Medication Sig Dispense Refill   IBUPROFEN PO Take by mouth as needed.     meclizine (ANTIVERT) 25 MG tablet 1/2-1 pill up to 3 times daily for motion sickness/dizziness (Patient not taking: Reported on 11/10/2022) 60 tablet 0   Current Facility-Administered Medications  Medication Dose Route Frequency Provider Last Rate Last Admin   0.9 %  sodium chloride infusion  500 mL Intravenous Once Daryel November, MD        Allergies as of 11/28/2022   (No Known Allergies)    Family History  Problem Relation Age of Onset   Breast cancer Mother    Melanoma Father 71   Heart attack Paternal Grandfather 67       ?diet of surgical complications   CAD Paternal Grandfather    Colon cancer Neg Hx    Colon polyps Neg Hx    Crohn's disease Neg Hx    Esophageal cancer Neg Hx    Rectal cancer Neg Hx    Stomach cancer Neg Hx    Ulcerative colitis Neg Hx     Social History   Socioeconomic History   Marital status: Married    Spouse name:  Not on file   Number of children: 1   Years of education: 16   Highest education level: Bachelor's degree (e.g., BA, AB, BS)  Occupational History   Not on file  Tobacco Use   Smoking status: Never   Smokeless tobacco: Never  Vaping Use   Vaping Use: Never used  Substance and Sexual Activity   Alcohol use: Yes    Alcohol/week: 2.0 standard drinks of alcohol    Types: 2 Standard drinks or equivalent per week    Comment: occassionally   Drug use: No   Sexual activity: Yes    Partners: Female    Birth control/protection: Pill  Other Topics Concern   Not on file  Social History Narrative   Not on file   Social Determinants of Health   Financial Resource Strain: Not on file  Food Insecurity: Not on file  Transportation Needs: Not on file  Physical Activity: Not on file  Stress: Not on file  Social Connections: Not on file  Intimate Partner Violence: Not on file    Review of Systems:  All other review of systems negative except as mentioned in the HPI.  Physical Exam: Vital signs BP 129/73 (BP Location: Right Arm, Patient Position: Sitting, Cuff Size: Normal)   Pulse  74   Temp 98 F (36.7 C) (Temporal)   Ht '6\' 1"'$  (1.854 m)   Wt 200 lb (90.7 kg)   SpO2 97%   BMI 26.39 kg/m   General:   Alert,  Well-developed, well-nourished, pleasant and cooperative in NAD Airway:  Mallampati 1 Lungs:  Clear throughout to auscultation.   Heart:  Regular rate and rhythm; no murmurs, clicks, rubs,  or gallops. Abdomen:  Soft, nontender and nondistended. Normal bowel sounds.   Neuro/Psych:  Normal mood and affect. A and O x 3   Jezabelle Chisolm E. Candis Schatz, MD [facility]

## 2022-11-28 NOTE — Op Note (Signed)
[facility] Patient Name: Jared Trevino Procedure Date: 11/28/2022 11:20 AM MRN: [id number] Endoscopist: Nicki Reaper E. Candis Schatz , MD, [id number] Age: 47 Referring MD:  Date of Birth: Jan 28, 1976 Gender: Male Account #: [id number] Procedure:                Colonoscopy Indications:              Screening for colorectal malignant neoplasm, This                            is the patient's first colonoscopy Medicines:                Monitored Anesthesia Care Procedure:                Pre-Anesthesia Assessment:                           - Prior to the procedure, a History and Physical                            was performed, and patient medications and                            allergies were reviewed. The patient's tolerance of                            previous anesthesia was also reviewed. The risks                            and benefits of the procedure and the sedation                            options and risks were discussed with the patient.                            All questions were answered, and informed consent                            was obtained. Prior Anticoagulants: The patient has                            taken no anticoagulant or antiplatelet agents. ASA                            Grade Assessment: I - A normal, healthy patient.                            After reviewing the risks and benefits, the patient                            was deemed in satisfactory condition to undergo the                            procedure.  After obtaining informed consent, the colonoscope                            was passed under direct vision. Throughout the                            procedure, the patient's blood pressure, pulse, and                            oxygen saturations were monitored continuously. The                            CF [id number] #[id number] was introduced through the anus                            and advanced to the the  terminal ileum, with                            identification of the appendiceal orifice and IC                            valve. The colonoscopy was performed without                            difficulty. The patient tolerated the procedure                            well. The quality of the bowel preparation was                            good. The terminal ileum, ileocecal valve,                            appendiceal orifice, and rectum were photographed.                            The bowel preparation used was SUPREP via split                            dose instruction. Scope In: 11:31:43 AM Scope Out: 11:45:57 AM Scope Withdrawal Time: 0 hours 10 minutes 44 seconds  Total Procedure Duration: 0 hours 14 minutes 14 seconds  Findings:                 The perianal and digital rectal examinations were                            normal. Pertinent negatives include normal                            sphincter tone and no palpable rectal lesions.                           A 2 mm polyp was found in the cecum adjacent to the  appendiceal orifice, but not involving it. The                            polyp was sessile. The polyp was removed with a                            cold snare. Resection and retrieval were complete.                            Estimated blood loss was minimal.                           Multiple large-mouthed and small-mouthed                            diverticula were found in the sigmoid colon and                            descending colon. There was no evidence of                            diverticular bleeding.                           The exam was otherwise normal throughout the                            examined colon.                           The terminal ileum appeared normal.                           The retroflexed view of the distal rectum and anal                            verge was normal and showed no anal or rectal                             abnormalities. Complications:            No immediate complications. Estimated Blood Loss:     Estimated blood loss was minimal. Impression:               - One 2 mm polyp in the cecum, removed with a cold                            snare. Resected and retrieved.                           - Moderate diverticulosis in the sigmoid colon and                            in the descending colon. There was no evidence of  diverticular bleeding.                           - The examined portion of the ileum was normal.                           - The distal rectum and anal verge are normal on                            retroflexion view. Recommendation:           - Patient has a contact number available for                            emergencies. The signs and symptoms of potential                            delayed complications were discussed with the                            patient. Return to normal activities tomorrow.                            Written discharge instructions were provided to the                            patient.                           - Resume previous diet.                           - Continue present medications.                           - Await pathology results.                           - Repeat colonoscopy (date not yet determined) for                            surveillance based on pathology results. Amarise Lillo E. Candis Schatz, MD 11/28/2022 11:52:51 AM This report has been signed electronically.

## 2022-11-28 NOTE — Progress Notes (Signed)
Vitals-DT  Pt's states no medical or surgical changes since previsit or office visit.  

## 2022-11-28 NOTE — Progress Notes (Signed)
Report to pacu rn. Vss. Care resumed by rn. 

## 2022-11-29 ENCOUNTER — Telehealth: Payer: Self-pay | Admitting: *Deleted

## 2022-11-29 NOTE — Telephone Encounter (Signed)
Left message on f/u call 

## 2022-12-01 NOTE — Progress Notes (Signed)
Jared Trevino,  The polyp which I removed during your recent procedure was proven to be completely benign but is considered a "pre-cancerous" polyp that MAY have grown into cancer if it had not been removed.  Studies shows that at least 20% of women over age 46 and 30% of men over age 41 have pre-cancerous polyps.  Based on current nationally recognized surveillance guidelines, I recommend that you have a repeat colonoscopy in 7 years.   If you develop any new rectal bleeding, abdominal pain or significant bowel habit changes, please contact me before then.

## 2022-12-07 DIAGNOSIS — R972 Elevated prostate specific antigen [PSA]: Secondary | ICD-10-CM | POA: Diagnosis not present

## 2022-12-12 DIAGNOSIS — M25551 Pain in right hip: Secondary | ICD-10-CM | POA: Diagnosis not present

## 2022-12-12 DIAGNOSIS — M62830 Muscle spasm of back: Secondary | ICD-10-CM | POA: Diagnosis not present

## 2022-12-12 DIAGNOSIS — M546 Pain in thoracic spine: Secondary | ICD-10-CM | POA: Diagnosis not present

## 2022-12-12 DIAGNOSIS — M9905 Segmental and somatic dysfunction of pelvic region: Secondary | ICD-10-CM | POA: Diagnosis not present

## 2022-12-12 DIAGNOSIS — M9903 Segmental and somatic dysfunction of lumbar region: Secondary | ICD-10-CM | POA: Diagnosis not present

## 2022-12-12 DIAGNOSIS — M9902 Segmental and somatic dysfunction of thoracic region: Secondary | ICD-10-CM | POA: Diagnosis not present

## 2022-12-12 DIAGNOSIS — M9904 Segmental and somatic dysfunction of sacral region: Secondary | ICD-10-CM | POA: Diagnosis not present

## 2022-12-14 DIAGNOSIS — Z125 Encounter for screening for malignant neoplasm of prostate: Secondary | ICD-10-CM | POA: Diagnosis not present

## 2022-12-14 DIAGNOSIS — E785 Hyperlipidemia, unspecified: Secondary | ICD-10-CM | POA: Diagnosis not present

## 2022-12-14 DIAGNOSIS — R7989 Other specified abnormal findings of blood chemistry: Secondary | ICD-10-CM | POA: Diagnosis not present

## 2022-12-21 DIAGNOSIS — M546 Pain in thoracic spine: Secondary | ICD-10-CM | POA: Diagnosis not present

## 2022-12-21 DIAGNOSIS — R82998 Other abnormal findings in urine: Secondary | ICD-10-CM | POA: Diagnosis not present

## 2022-12-21 DIAGNOSIS — Z1339 Encounter for screening examination for other mental health and behavioral disorders: Secondary | ICD-10-CM | POA: Diagnosis not present

## 2022-12-21 DIAGNOSIS — J309 Allergic rhinitis, unspecified: Secondary | ICD-10-CM | POA: Diagnosis not present

## 2022-12-21 DIAGNOSIS — R972 Elevated prostate specific antigen [PSA]: Secondary | ICD-10-CM | POA: Diagnosis not present

## 2022-12-21 DIAGNOSIS — Z0000 Encounter for general adult medical examination without abnormal findings: Secondary | ICD-10-CM | POA: Diagnosis not present

## 2022-12-21 DIAGNOSIS — E785 Hyperlipidemia, unspecified: Secondary | ICD-10-CM | POA: Diagnosis not present

## 2022-12-21 DIAGNOSIS — Z1331 Encounter for screening for depression: Secondary | ICD-10-CM | POA: Diagnosis not present

## 2022-12-29 DIAGNOSIS — M9901 Segmental and somatic dysfunction of cervical region: Secondary | ICD-10-CM | POA: Diagnosis not present

## 2022-12-29 DIAGNOSIS — M542 Cervicalgia: Secondary | ICD-10-CM | POA: Diagnosis not present

## 2022-12-29 DIAGNOSIS — M545 Low back pain, unspecified: Secondary | ICD-10-CM | POA: Diagnosis not present

## 2022-12-29 DIAGNOSIS — G43C0 Periodic headache syndromes in child or adult, not intractable: Secondary | ICD-10-CM | POA: Diagnosis not present

## 2023-02-10 DIAGNOSIS — M9904 Segmental and somatic dysfunction of sacral region: Secondary | ICD-10-CM | POA: Diagnosis not present

## 2023-02-10 DIAGNOSIS — M546 Pain in thoracic spine: Secondary | ICD-10-CM | POA: Diagnosis not present

## 2023-02-10 DIAGNOSIS — M9905 Segmental and somatic dysfunction of pelvic region: Secondary | ICD-10-CM | POA: Diagnosis not present

## 2023-02-10 DIAGNOSIS — M545 Low back pain, unspecified: Secondary | ICD-10-CM | POA: Diagnosis not present

## 2023-03-24 DIAGNOSIS — M25551 Pain in right hip: Secondary | ICD-10-CM | POA: Diagnosis not present

## 2023-03-24 DIAGNOSIS — M545 Low back pain, unspecified: Secondary | ICD-10-CM | POA: Diagnosis not present

## 2023-03-24 DIAGNOSIS — M7601 Gluteal tendinitis, right hip: Secondary | ICD-10-CM | POA: Diagnosis not present

## 2023-03-24 DIAGNOSIS — M9903 Segmental and somatic dysfunction of lumbar region: Secondary | ICD-10-CM | POA: Diagnosis not present

## 2023-03-31 DIAGNOSIS — L218 Other seborrheic dermatitis: Secondary | ICD-10-CM | POA: Diagnosis not present

## 2023-03-31 DIAGNOSIS — L814 Other melanin hyperpigmentation: Secondary | ICD-10-CM | POA: Diagnosis not present

## 2023-03-31 DIAGNOSIS — L821 Other seborrheic keratosis: Secondary | ICD-10-CM | POA: Diagnosis not present

## 2023-03-31 DIAGNOSIS — L02821 Furuncle of head [any part, except face]: Secondary | ICD-10-CM | POA: Diagnosis not present

## 2023-07-28 DIAGNOSIS — R972 Elevated prostate specific antigen [PSA]: Secondary | ICD-10-CM | POA: Diagnosis not present

## 2023-08-04 DIAGNOSIS — R972 Elevated prostate specific antigen [PSA]: Secondary | ICD-10-CM | POA: Diagnosis not present

## 2024-04-11 ENCOUNTER — Other Ambulatory Visit: Payer: Self-pay | Admitting: Urology

## 2024-04-11 DIAGNOSIS — R972 Elevated prostate specific antigen [PSA]: Secondary | ICD-10-CM

## 2024-04-12 ENCOUNTER — Encounter: Payer: Self-pay | Admitting: Urology

## 2024-05-23 ENCOUNTER — Ambulatory Visit
Admission: RE | Admit: 2024-05-23 | Discharge: 2024-05-23 | Disposition: A | Discharge status: Home / Self Care | Source: Ambulatory Visit | Attending: Urology | Admitting: Urology

## 2024-05-23 DIAGNOSIS — R972 Elevated prostate specific antigen [PSA]: Secondary | ICD-10-CM

## 2024-05-23 MED ORDER — GADOPICLENOL 0.5 MMOL/ML IV SOLN
9.0000 mL | Freq: Once | INTRAVENOUS | Status: AC | PRN
  Administered 2024-05-23: 9 mL via INTRAVENOUS

## 2024-07-23 ENCOUNTER — Ambulatory Visit: Admitting: Orthopaedic Surgery

## 2024-09-02 ENCOUNTER — Encounter: Payer: Self-pay | Admitting: Radiology
